# Patient Record
Sex: Male | Born: 1965 | Race: White | Marital: Single
Health system: Southern US, Community
[De-identification: ages and names within clinical notes are randomized; demographics above are authoritative.]

## PROBLEM LIST (undated history)

## (undated) DIAGNOSIS — F319 Bipolar disorder, unspecified: Secondary | ICD-10-CM

---

## 2020-09-18 ENCOUNTER — Ambulatory Visit: Payer: Medicare Other | Admitting: Podiatry

## 2020-09-22 ENCOUNTER — Ambulatory Visit: Payer: Medicare Other | Admitting: Podiatry

## 2020-09-24 ENCOUNTER — Ambulatory Visit: Payer: Medicare Other | Admitting: Podiatry

## 2020-12-29 ENCOUNTER — Other Ambulatory Visit: Payer: Self-pay

## 2020-12-29 ENCOUNTER — Ambulatory Visit (HOSPITAL_COMMUNITY)
Admission: EM | Admit: 2020-12-29 | Discharge: 2020-12-29 | Disposition: A | Discharge status: Other Acute Inpatient Hospital | Payer: Medicare Other | Attending: Psychiatry | Admitting: Psychiatry

## 2020-12-29 DIAGNOSIS — F312 Bipolar disorder, current episode manic severe with psychotic features: Secondary | ICD-10-CM | POA: Diagnosis not present

## 2020-12-29 DIAGNOSIS — Z20822 Contact with and (suspected) exposure to covid-19: Secondary | ICD-10-CM | POA: Diagnosis not present

## 2020-12-29 DIAGNOSIS — Z79899 Other long term (current) drug therapy: Secondary | ICD-10-CM | POA: Diagnosis not present

## 2020-12-29 LAB — TSH: TSH: 1.003 u[IU]/mL (ref 0.350–4.500)

## 2020-12-29 LAB — POCT URINE DRUG SCREEN - MANUAL ENTRY (I-SCREEN)
POC Amphetamine UR: NOT DETECTED
POC Buprenorphine (BUP): NOT DETECTED
POC Cocaine UR: NOT DETECTED
POC Marijuana UR: POSITIVE — AB
POC Methadone UR: NOT DETECTED
POC Methamphetamine UR: NOT DETECTED
POC Morphine: NOT DETECTED
POC Oxazepam (BZO): NOT DETECTED
POC Oxycodone UR: NOT DETECTED
POC Secobarbital (BAR): NOT DETECTED

## 2020-12-29 LAB — URINALYSIS, ROUTINE W REFLEX MICROSCOPIC
Bilirubin Urine: NEGATIVE
Glucose, UA: NEGATIVE mg/dL
Hgb urine dipstick: NEGATIVE
Ketones, ur: 5 mg/dL — AB
Leukocytes,Ua: NEGATIVE
Nitrite: NEGATIVE
Protein, ur: NEGATIVE mg/dL
Specific Gravity, Urine: 1.003 — ABNORMAL LOW (ref 1.005–1.030)
pH: 7 (ref 5.0–8.0)

## 2020-12-29 LAB — RESP PANEL BY RT-PCR (FLU A&B, COVID) ARPGX2
Influenza A by PCR: NEGATIVE
Influenza B by PCR: NEGATIVE
SARS Coronavirus 2 by RT PCR: NEGATIVE

## 2020-12-29 LAB — POC SARS CORONAVIRUS 2 AG -  ED: SARS Coronavirus 2 Ag: NEGATIVE

## 2020-12-29 LAB — POC SARS CORONAVIRUS 2 AG: SARSCOV2ONAVIRUS 2 AG: NEGATIVE

## 2020-12-29 MED ORDER — OLANZAPINE 5 MG PO TABS
ORAL_TABLET | ORAL | Status: AC
  Administered 2020-12-29: 5 mg via ORAL
  Filled 2020-12-29: qty 1

## 2020-12-29 MED ORDER — OLANZAPINE 5 MG PO TABS: 5.0000 mg | ORAL_TABLET | Freq: Every day | ORAL | Status: DC

## 2020-12-29 MED ORDER — OLANZAPINE 5 MG PO TABS
5.0000 mg | ORAL_TABLET | Freq: Every day | ORAL | Status: DC
  Administered 2020-12-29: 5 mg via ORAL

## 2020-12-29 NOTE — Progress Notes (Signed)
Patient meets inpatient criteria per Dr. Morrie Sheldon.  Per [facility] no [facility] beds available.  CSW to refer out.  Patient was referred to the following facilities:  Service Provider Address Phone Fax  [facility]  [address], [place] [address] [phone]  [facility]  [address] [place] [address] [phone]  [facility]  [address] [place] [address] [phone]  [facility]  [address] [place], [place] [address] [phone]  [facility]  [address] [place] [address] [phone]  [facility]  [address] [place] [address] [phone]  [facility]  [address] [place] [address] [phone]  [facility]  [address], [place] [address] [phone]  [facility]  [address], [place] [address] [phone]  [facility]  [address] [place], [place] [address] [phone]  [facility]  [address] [place] [address] [phone]     CSW will continue to monitor for disposition.  Penni Homans, MSW, LCSW 12/29/2020 1:29 PM

## 2020-12-29 NOTE — ED Notes (Signed)
Ambulated per self to retrieve belongings. No s/s pain or discomfort. No SI or HI. Escorted out back s[place] to safe transport. Transported to [facility]. Medically stable at time of transfer
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
## 2020-12-29 NOTE — Discharge Instructions (Addendum)
Please speak w/ SW about your eviction and possessions.

## 2020-12-29 NOTE — ED Notes (Signed)
Report called to [facility] charge nurse Misty Stanley, RN

## 2020-12-29 NOTE — ED Notes (Addendum)
Pt ambulated per self on unit. No s/s pain, discomfort, or acute distress noted. A&O x4. Denies SI, HI, AVH. Endorsing paranoia. Oriented to unit and staff. Will continue to monitor for safety

## 2020-12-29 NOTE — ED Notes (Signed)
Safe transportation called for transports to [facility]
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
## 2020-12-30 ENCOUNTER — Encounter (HOSPITAL_COMMUNITY): Payer: Self-pay | Admitting: Student in an Organized Health Care Education/Training Program

## 2020-12-30 ENCOUNTER — Emergency Department (HOSPITAL_COMMUNITY): Payer: Medicare Other

## 2020-12-30 ENCOUNTER — Encounter (HOSPITAL_COMMUNITY): Payer: Self-pay

## 2020-12-30 ENCOUNTER — Emergency Department (HOSPITAL_COMMUNITY)
Admission: EM | Admit: 2020-12-30 | Discharge: 2020-12-31 | Disposition: A | Discharge status: Home / Self Care | Payer: Medicare Other | Source: Home / Self Care | Attending: Emergency Medicine | Admitting: Emergency Medicine

## 2020-12-30 ENCOUNTER — Other Ambulatory Visit: Payer: Self-pay

## 2020-12-30 ENCOUNTER — Emergency Department (HOSPITAL_COMMUNITY)
Admission: EM | Admit: 2020-12-30 | Discharge: 2020-12-30 | Disposition: A | Discharge status: Home / Self Care | Payer: Medicare Other | Attending: Emergency Medicine | Admitting: Emergency Medicine

## 2020-12-30 DIAGNOSIS — R109 Unspecified abdominal pain: Secondary | ICD-10-CM | POA: Diagnosis not present

## 2020-12-30 DIAGNOSIS — R000 Tachycardia, unspecified: Secondary | ICD-10-CM | POA: Insufficient documentation

## 2020-12-30 DIAGNOSIS — R198 Other specified symptoms and signs involving the digestive system and abdomen: Secondary | ICD-10-CM | POA: Diagnosis not present

## 2020-12-30 DIAGNOSIS — F319 Bipolar disorder, unspecified: Secondary | ICD-10-CM | POA: Diagnosis not present

## 2020-12-30 DIAGNOSIS — R197 Diarrhea, unspecified: Secondary | ICD-10-CM | POA: Diagnosis present

## 2020-12-30 DIAGNOSIS — N179 Acute kidney failure, unspecified: Secondary | ICD-10-CM | POA: Diagnosis not present

## 2020-12-30 LAB — CBC WITH DIFFERENTIAL/PLATELET
Abs Immature Granulocytes: 0.08 10*3/uL — ABNORMAL HIGH (ref 0.00–0.07)
Basophils Absolute: 0 10*3/uL (ref 0.0–0.1)
Basophils Relative: 0 %
Eosinophils Absolute: 0 10*3/uL (ref 0.0–0.5)
Eosinophils Relative: 0 %
HCT: 43.5 % (ref 39.0–52.0)
Hemoglobin: 14.9 g/dL (ref 13.0–17.0)
Immature Granulocytes: 1 %
Lymphocytes Relative: 16 %
Lymphs Abs: 2.4 10*3/uL (ref 0.7–4.0)
MCH: 33.8 pg (ref 26.0–34.0)
MCHC: 34.3 g/dL (ref 30.0–36.0)
MCV: 98.6 fL (ref 80.0–100.0)
Monocytes Absolute: 1.6 10*3/uL — ABNORMAL HIGH (ref 0.1–1.0)
Monocytes Relative: 11 %
Neutro Abs: 10.8 10*3/uL — ABNORMAL HIGH (ref 1.7–7.7)
Neutrophils Relative %: 72 %
Platelets: 145 10*3/uL — ABNORMAL LOW (ref 150–400)
RBC: 4.41 MIL/uL (ref 4.22–5.81)
RDW: 13.7 % (ref 11.5–15.5)
WBC: 14.9 10*3/uL — ABNORMAL HIGH (ref 4.0–10.5)
nRBC: 0 % (ref 0.0–0.2)

## 2020-12-30 LAB — HEPATIC FUNCTION PANEL
ALT: 50 U/L — ABNORMAL HIGH (ref 0–44)
AST: 73 U/L — ABNORMAL HIGH (ref 15–41)
Albumin: 4.6 g/dL (ref 3.5–5.0)
Alkaline Phosphatase: 96 U/L (ref 38–126)
Bilirubin, Direct: 0.3 mg/dL — ABNORMAL HIGH (ref 0.0–0.2)
Indirect Bilirubin: 0.7 mg/dL (ref 0.3–0.9)
Total Bilirubin: 1 mg/dL (ref 0.3–1.2)
Total Protein: 7.8 g/dL (ref 6.5–8.1)

## 2020-12-30 LAB — BASIC METABOLIC PANEL
Anion gap: 14 (ref 5–15)
BUN: 30 mg/dL — ABNORMAL HIGH (ref 6–20)
CO2: 22 mmol/L (ref 22–32)
Calcium: 9.5 mg/dL (ref 8.9–10.3)
Chloride: 104 mmol/L (ref 98–111)
Creatinine, Ser: 1.52 mg/dL — ABNORMAL HIGH (ref 0.61–1.24)
GFR, Estimated: 54 mL/min — ABNORMAL LOW (ref >60)
Glucose, Bld: 91 mg/dL (ref 70–99)
Potassium: 3.8 mmol/L (ref 3.5–5.1)
Sodium: 140 mmol/L (ref 135–145)

## 2020-12-30 LAB — LIPASE, BLOOD: Lipase: 52 U/L — ABNORMAL HIGH (ref 11–51)

## 2020-12-30 MED ORDER — IOHEXOL 350 MG/ML SOLN
80.0000 mL | Freq: Once | INTRAVENOUS | Status: AC | PRN
  Administered 2020-12-30: 80 mL via INTRAVENOUS

## 2020-12-30 MED ORDER — SODIUM CHLORIDE 0.9 % IV BOLUS
1000.0000 mL | Freq: Once | INTRAVENOUS | Status: AC
  Administered 2020-12-30: 1000 mL via INTRAVENOUS

## 2020-12-30 NOTE — ED Notes (Signed)
Went in to discharge pt and he was not in his room. MD aware.

## 2020-12-30 NOTE — ED Provider Notes (Signed)
[facility]-EMERGENCY DEPT Provider Note   CSN: [id number] Arrival date & time: 12/30/20  1341     History Chief Complaint  Patient presents with   Diarrhea    Gregory Cross is a 55 y.o. male.  Patient is a 55 year old male senting for complaints of diarrhea.  Patient states "I have liquid leaking from my rectum" x 24 hours.  Denies abdominal pain, fever, chills, nausea, vomiting.  No sick contacts.  Denies recent antibiotic use or previous history of C. difficile.  Denies hx of UC or IBS.  The history is provided by the patient. No language interpreter was used.  Diarrhea Quality:  Watery Severity:  Severe Onset quality:  Sudden Duration:  24 hours Associated symptoms: no abdominal pain, no arthralgias, no chills, no fever and no vomiting       Past Medical History:  Diagnosis Date   Bipolar 1 disorder (HCC)     There are no problems to display for this patient.   No past surgical history on file.     No family history on file.  Social History   Tobacco Use   Smoking status: Unknown    Home Medications Prior to Admission medications   Medication Sig Start Date End Date Taking? Authorizing Provider  albuterol (VENTOLIN HFA) 108 (90 Base) MCG/ACT inhaler Inhale 2 puffs into the lungs every 4 (four) hours as needed for shortness of breath. 10/14/20  Yes [provider]  Cholecalciferol (VITAMIN D3) 50 MCG (2000 UT) capsule Take 2,000 Units by mouth daily.   Yes [provider]  diphenhydramine-acetaminophen (TYLENOL PM) 25-500 MG TABS tablet Take 2 tablets by mouth at bedtime as needed (For sleep or pain.).   Yes [provider]  losartan (COZAAR) 50 MG tablet Take 50 mg by mouth daily. 11/10/20  Yes [provider]  methocarbamol (ROBAXIN) 500 MG tablet Take 500 mg by mouth daily as needed for muscle spasms. 10/29/20  Yes [provider]  Misc Natural Products (ADV TURMERIC CURCUMIN COMPLEX PO) Take 1  tablet by mouth daily.   Yes [provider]  Multiple Vitamin (MULTIVITAMIN WITH MINERALS) TABS tablet Take 1 tablet by mouth daily.   Yes [provider]  POTASSIUM PO Take 1 tablet by mouth daily.   Yes [provider]    Allergies    Patient has no known allergies.  Review of Systems   Review of Systems  Constitutional:  Negative for chills and fever.  HENT:  Negative for ear pain and sore throat.   Eyes:  Negative for pain and visual disturbance.  Respiratory:  Negative for cough and shortness of breath.   Cardiovascular:  Negative for chest pain and palpitations.  Gastrointestinal:  Positive for diarrhea. Negative for abdominal pain and vomiting.  Genitourinary:  Negative for dysuria and hematuria.  Musculoskeletal:  Negative for arthralgias and back pain.  Skin:  Negative for color change and rash.  Neurological:  Negative for seizures and syncope.  All other systems reviewed and are negative.  Physical Exam Updated Vital Signs BP (!) 129/92   Pulse 88   Temp 98.2 F (36.8 C) (Oral)   Resp 16   Ht 5\' 11"  (1.803 m)   Wt 68 kg   SpO2 99%   BMI 20.92 kg/m   Physical Exam Vitals and nursing note reviewed.  Constitutional:      Appearance: He is well-developed.     Comments: Foul odor.  Patient's cloths saturated in feculent material.  HENT:     Head: Normocephalic and atraumatic.  Eyes:     Conjunctiva/sclera: Conjunctivae normal.  Cardiovascular:     Rate and Rhythm: Normal rate and regular rhythm.     Heart sounds: No murmur heard. Pulmonary:     Effort: Pulmonary effort is normal. No respiratory distress.     Breath sounds: Normal breath sounds.  Abdominal:     Palpations: Abdomen is soft.     Tenderness: There is no abdominal tenderness.  Musculoskeletal:     Cervical back: Neck supple.  Skin:    General: Skin is warm and dry.  Neurological:     Mental Status: He is alert.     GCS: GCS eye subscore is 4. GCS verbal subscore  is 5. GCS motor subscore is 6.    ED Results / Procedures / Treatments   Labs (all labs ordered are listed, but only abnormal results are displayed) Labs Reviewed  BASIC METABOLIC PANEL - Abnormal; Notable for the following components:      Result Value   BUN 30 (*)    Creatinine, Ser 1.52 (*)    GFR, Estimated 54 (*)    All other components within normal limits  CBC WITH DIFFERENTIAL/PLATELET - Abnormal; Notable for the following components:   WBC 14.9 (*)    Platelets 145 (*)    Neutro Abs 10.8 (*)    Monocytes Absolute 1.6 (*)    Abs Immature Granulocytes 0.08 (*)    All other components within normal limits  HEPATIC FUNCTION PANEL - Abnormal; Notable for the following components:   AST 73 (*)    ALT 50 (*)    Bilirubin, Direct 0.3 (*)    All other components within normal limits  LIPASE, BLOOD - Abnormal; Notable for the following components:   Lipase 52 (*)    All other components within normal limits  OVA + PARASITE EXAM    EKG None  Radiology CT ABDOMEN PELVIS W CONTRAST  Result Date: 12/30/2020 CLINICAL DATA:  Diarrhea EXAM: CT ABDOMEN AND PELVIS WITH CONTRAST TECHNIQUE: Multidetector CT imaging of the abdomen and pelvis was performed using the standard protocol following bolus administration of intravenous contrast. CONTRAST:  110mL OMNIPAQUE IOHEXOL 350 MG/ML SOLN COMPARISON:  None. FINDINGS: Lower chest: Lung bases are clear. No effusions. Heart is normal size. Hepatobiliary: Diffuse low-density throughout the liver compatible with fatty infiltration. No focal abnormality. Gallbladder unremarkable. Pancreas: No focal abnormality or ductal dilatation. Spleen: No focal abnormality.  Normal size. Adrenals/Urinary Tract: Scattered hypodensities in the kidneys, likely small cysts less than 1 cm. No hydronephrosis. Adrenal glands and urinary bladder unremarkable. Stomach/Bowel: Stomach, large and small bowel grossly unremarkable. Vascular/Lymphatic: No evidence of aneurysm or  adenopathy. Aortic atherosclerosis. Reproductive: Mildly prominent prostate with central calcifications. Other: No free fluid or free air. Musculoskeletal: No acute bony abnormality. IMPRESSION: Mild hepatic steatosis. No bowel abnormality seen. No acute findings. Aortic atherosclerosis. Electronically Signed   By: Charlett Nose M.D.   On: 12/30/2020 18:02    Procedures Procedures   Medications Ordered in ED Medications  sodium chloride 0.9 % bolus 1,000 mL (0 mLs Intravenous Stopped 12/30/20 1855)  iohexol (OMNIPAQUE) 350 MG/ML injection 80 mL (80 mLs Intravenous Contrast Given 12/30/20 1731)    ED Course  I have reviewed the triage vital signs and the nursing notes.  Pertinent labs & imaging results that were available during my care of the patient were reviewed by me and considered in my medical decision making (see chart  for details).    MDM Rules/Calculators/A&P                           55 year old male senting for complaints of diarrhea.  Patient states "I have liquid leaking from my rectum" x 24 hours.  Patient is alert and oriented x3, no acute distress, afebrile, stable vital signs.  Physical exam demonstrates no abdominal tenderness.  Foul odor present.  Patient's clothing saturated in feculent material. Patient remained stable on reevaluation.  -Stable electrolytes.  AKI present likely secondary to dehydration.  IV fluids given. -Ova, parasites, and C. difficile culture ordered, pending collection upon next bowel movement.   CT abdomen results back demonstrating no acute process.  I went to patient's room to discuss findings with him and patient unable to be found at this time.  Patient is no longer in his room, no belongings are gone.  Spoke with patient's nurse who states she has not seen him.  IV was not removed by nursing or medical personnel.  Security was notified.  Patient likely eloped from emergency department.   Final Clinical Impression(s) / ED Diagnoses Final  diagnoses:  Diarrhea, unspecified type  AKI (acute kidney injury) [facility])    Rx / DC Orders ED Discharge Orders     None        Franne Forts, DO 12/31/20 1410

## 2020-12-30 NOTE — ED Triage Notes (Signed)
Ems brings pt in for diarrhea. Pt states he has had clear liquid leaking from his rectum for 24 hours. Now he states his stool is more solid. Pt denies abdominal pain.

## 2020-12-30 NOTE — Discharge Instructions (Addendum)
Today you gave stool samples for testing for ova, parasite, and C. Difficile.  I will call you in the next 48 hours if results come back positive for any reason further recommendations.  In the meantime, stay rated by drinking 8 to 10 12 oz glasses of water a day.

## 2020-12-30 NOTE — ED Triage Notes (Signed)
Pt reports having diarrhea

## 2020-12-31 ENCOUNTER — Emergency Department (HOSPITAL_COMMUNITY)
Admission: EM | Admit: 2020-12-31 | Discharge: 2021-01-01 | Disposition: A | Discharge status: Home / Self Care | Payer: Medicare Other | Attending: Emergency Medicine | Admitting: Emergency Medicine

## 2020-12-31 ENCOUNTER — Encounter (HOSPITAL_COMMUNITY): Payer: Self-pay

## 2020-12-31 DIAGNOSIS — F319 Bipolar disorder, unspecified: Secondary | ICD-10-CM | POA: Insufficient documentation

## 2020-12-31 DIAGNOSIS — Z1330 Encounter for screening examination for mental health and behavioral disorders, unspecified: Secondary | ICD-10-CM | POA: Insufficient documentation

## 2020-12-31 DIAGNOSIS — Z20822 Contact with and (suspected) exposure to covid-19: Secondary | ICD-10-CM | POA: Insufficient documentation

## 2020-12-31 DIAGNOSIS — R197 Diarrhea, unspecified: Secondary | ICD-10-CM | POA: Diagnosis not present

## 2020-12-31 DIAGNOSIS — F1510 Other stimulant abuse, uncomplicated: Secondary | ICD-10-CM | POA: Diagnosis not present

## 2020-12-31 DIAGNOSIS — Z79899 Other long term (current) drug therapy: Secondary | ICD-10-CM | POA: Insufficient documentation

## 2020-12-31 HISTORY — DX: Bipolar disorder, unspecified: F31.9

## 2020-12-31 LAB — RAPID URINE DRUG SCREEN, HOSP PERFORMED
Amphetamines: NOT DETECTED
Barbiturates: NOT DETECTED
Benzodiazepines: NOT DETECTED
Cocaine: NOT DETECTED
Opiates: NOT DETECTED
Tetrahydrocannabinol: POSITIVE — AB

## 2020-12-31 LAB — COMPREHENSIVE METABOLIC PANEL
ALT: 48 U/L — ABNORMAL HIGH (ref 0–44)
AST: 73 U/L — ABNORMAL HIGH (ref 15–41)
Albumin: 4.4 g/dL (ref 3.5–5.0)
Alkaline Phosphatase: 86 U/L (ref 38–126)
Anion gap: 10 (ref 5–15)
BUN: 23 mg/dL — ABNORMAL HIGH (ref 6–20)
CO2: 26 mmol/L (ref 22–32)
Calcium: 9.3 mg/dL (ref 8.9–10.3)
Chloride: 103 mmol/L (ref 98–111)
Creatinine, Ser: 0.93 mg/dL (ref 0.61–1.24)
GFR, Estimated: >60 mL/min (ref >60)
Glucose, Bld: 96 mg/dL (ref 70–99)
Potassium: 3.6 mmol/L (ref 3.5–5.1)
Sodium: 139 mmol/L (ref 135–145)
Total Bilirubin: 1.5 mg/dL — ABNORMAL HIGH (ref 0.3–1.2)
Total Protein: 7.3 g/dL (ref 6.5–8.1)

## 2020-12-31 LAB — CBC WITH DIFFERENTIAL/PLATELET
Abs Immature Granulocytes: 0.03 10*3/uL (ref 0.00–0.07)
Basophils Absolute: 0 10*3/uL (ref 0.0–0.1)
Basophils Relative: 0 %
Eosinophils Absolute: 0 10*3/uL (ref 0.0–0.5)
Eosinophils Relative: 0 %
HCT: 39.6 % (ref 39.0–52.0)
Hemoglobin: 13.7 g/dL (ref 13.0–17.0)
Immature Granulocytes: 0 %
Lymphocytes Relative: 22 %
Lymphs Abs: 2 10*3/uL (ref 0.7–4.0)
MCH: 34.4 pg — ABNORMAL HIGH (ref 26.0–34.0)
MCHC: 34.6 g/dL (ref 30.0–36.0)
MCV: 99.5 fL (ref 80.0–100.0)
Monocytes Absolute: 1.1 10*3/uL — ABNORMAL HIGH (ref 0.1–1.0)
Monocytes Relative: 12 %
Neutro Abs: 6.1 10*3/uL (ref 1.7–7.7)
Neutrophils Relative %: 66 %
Platelets: 147 10*3/uL — ABNORMAL LOW (ref 150–400)
RBC: 3.98 MIL/uL — ABNORMAL LOW (ref 4.22–5.81)
RDW: 13.8 % (ref 11.5–15.5)
WBC: 9.3 10*3/uL (ref 4.0–10.5)
nRBC: 0 % (ref 0.0–0.2)

## 2020-12-31 LAB — BASIC METABOLIC PANEL
Anion gap: 8 (ref 5–15)
BUN: 28 mg/dL — ABNORMAL HIGH (ref 6–20)
CO2: 24 mmol/L (ref 22–32)
Calcium: 9.2 mg/dL (ref 8.9–10.3)
Chloride: 107 mmol/L (ref 98–111)
Creatinine, Ser: 1 mg/dL (ref 0.61–1.24)
GFR, Estimated: >60 mL/min (ref >60)
Glucose, Bld: 97 mg/dL (ref 70–99)
Potassium: 3.6 mmol/L (ref 3.5–5.1)
Sodium: 139 mmol/L (ref 135–145)

## 2020-12-31 LAB — RESP PANEL BY RT-PCR (FLU A&B, COVID) ARPGX2
Influenza A by PCR: NEGATIVE
Influenza B by PCR: NEGATIVE
SARS Coronavirus 2 by RT PCR: NEGATIVE

## 2020-12-31 LAB — ETHANOL: Alcohol, Ethyl (B): <10 mg/dL (ref <10)

## 2020-12-31 MED ORDER — ZIPRASIDONE MESYLATE 20 MG IM SOLR: 20.0000 mg | Freq: Once | INTRAMUSCULAR | Status: DC

## 2020-12-31 MED ORDER — LOPERAMIDE HCL 2 MG PO CAPS
4.0000 mg | ORAL_CAPSULE | Freq: Once | ORAL | Status: AC
  Administered 2020-12-31: 4 mg via ORAL
  Filled 2020-12-31: qty 2

## 2020-12-31 MED ORDER — LACTATED RINGERS IV BOLUS
1000.0000 mL | Freq: Once | INTRAVENOUS | Status: AC
  Administered 2020-12-31: 1000 mL via INTRAVENOUS

## 2020-12-31 NOTE — BH Assessment (Signed)
Requested Hailey, RN and Morrie Sheldon, RN to place TTS machine in patient's room.

## 2020-12-31 NOTE — ED Triage Notes (Signed)
Pt states "I want that damn book the police officer wrote about me yesterday and I want these people terrorizing me in parking lot this am since 4am to stop"

## 2020-12-31 NOTE — Discharge Instructions (Signed)
Take loperamide (imodium AD) as needed for diarrhea.

## 2020-12-31 NOTE — ED Provider Notes (Signed)
[facility]-EMERGENCY DEPT Provider Note   CSN: [id number] Arrival date & time: 12/30/20  2136     History Chief Complaint  Patient presents with   Diarrhea    Gregory Cross is a 55 y.o. male.  The history is provided by the patient.  Diarrhea He was in the emergency department earlier today because of diarrhea.  He describes of liquid dripping out his anus since this morning.  There has been some mild abdominal cramping but no fever or chills and no nausea or vomiting.  He had a CT of his abdomen and pelvis and states that he thought he was told that he should go when she returned from the CT scan, so he never received any prescriptions.  He returns to the emergency department because he has had recurrence of the diarrhea.   History reviewed. No pertinent past medical history.  There are no problems to display for this patient.   History reviewed. No pertinent surgical history.     History reviewed. No pertinent family history.     Home Medications Prior to Admission medications   Medication Sig Start Date End Date Taking? Authorizing Provider  albuterol (VENTOLIN HFA) 108 (90 Base) MCG/ACT inhaler Inhale 2 puffs into the lungs every 4 (four) hours as needed for shortness of breath. 10/14/20   [provider]  Cholecalciferol (VITAMIN D3) 50 MCG (2000 UT) capsule Take 2,000 Units by mouth daily.    [provider]  diphenhydramine-acetaminophen (TYLENOL PM) 25-500 MG TABS tablet Take 2 tablets by mouth at bedtime as needed (For sleep or pain.).    [provider]  losartan (COZAAR) 50 MG tablet Take 50 mg by mouth daily. 11/10/20   [provider]  methocarbamol (ROBAXIN) 500 MG tablet Take 500 mg by mouth daily as needed for muscle spasms. 10/29/20   [provider]  Misc Natural Products (ADV TURMERIC CURCUMIN COMPLEX PO) Take 1 tablet by mouth daily.    [provider]  Multiple Vitamin (MULTIVITAMIN  WITH MINERALS) TABS tablet Take 1 tablet by mouth daily.    [provider]  POTASSIUM PO Take 1 tablet by mouth daily.    [provider]    Allergies    Patient has no known allergies.  Review of Systems   Review of Systems  Gastrointestinal:  Positive for diarrhea.  All other systems reviewed and are negative.  Physical Exam Updated Vital Signs BP 98/79 (BP Location: Left Arm)   Pulse (!) 116   Temp 98.5 F (36.9 C) (Oral)   Resp 18   SpO2 97%   Physical Exam Vitals and nursing note reviewed.  55 year old male, resting comfortably and in no acute distress. Vital signs are significant for elevated heart rate. Oxygen saturation is 97%, which is normal. Head is normocephalic and atraumatic. PERRLA, EOMI. Oropharynx is clear. Neck is nontender and supple without adenopathy or JVD. Back is nontender and there is no CVA tenderness. Lungs are clear without rales, wheezes, or rhonchi. Chest is nontender. Heart has regular rate and rhythm without murmur. Abdomen is soft, flat, nontender without masses or hepatosplenomegaly and peristalsis is hypoactive. Extremities have no cyanosis or edema, full range of motion is present. Skin is warm and dry without rash. Neurologic: Mental status is normal, cranial nerves are intact, there are no motor or sensory deficits.  ED Results / Procedures / Treatments   Labs (all labs ordered are listed, but only abnormal results are displayed) Labs Reviewed  BASIC METABOLIC PANEL - Abnormal; Notable for the following components:      Result Value   BUN 28 (*)    All other components within normal limits  GASTROINTESTINAL PANEL BY PCR, STOOL (REPLACES STOOL CULTURE)  C DIFFICILE QUICK SCREEN W PCR REFLEX     Procedures Procedures   Medications Ordered in ED Medications  lactated ringers bolus 1,000 mL (has no administration in time range)  loperamide (IMODIUM) capsule 4 mg (has no administration in time range)    ED  Course  I have reviewed the triage vital signs and the nursing notes.  Pertinent labs & imaging results that were available during my care of the patient were reviewed by me and considered in my medical decision making (see chart for details).   MDM Rules/Calculators/A&P                         Diarrhea of uncertain cause.  Old records are reviewed confirming ED visit earlier today at which time the CT was read as showing mild hepatic steatosis with no bowel abnormality seen.  Labs showed an elevated creatinine of 1.52 with BUN of 30.  BUN to creatinine ratio of 20 suggestive of dehydration.  He also had minimal elevation of lipase, mild elevation of ALT and AST.  Exam is benign at this point.  He is tachycardic, will give additional IV fluids and recheck metabolic panel.  He has no prior labs in the system, so it is not known if his elevated creatinine is new or not.  Labs show improvement in creatinine back to baseline.  He has not had any diarrhea in the emergency department, therefore he is felt to be safe for discharge.  He is advised to use over-the-counter loperamide as needed for diarrhea.  Final Clinical Impression(s) / ED Diagnoses Final diagnoses:  Diarrhea, unspecified type    Rx / DC Orders ED Discharge Orders     None        Dione Booze, MD 12/31/20 0430

## 2020-12-31 NOTE — ED Provider Notes (Signed)
Emergency Medicine Provider Triage Evaluation Note  Gregory Cross , a 55 y.o. male  was evaluated in triage.  Pt complains of "those damn police officers wrote a whole book on me, I was thrown out here without a damn sandwich or glass of water, I have nowhere to go and people keep following me in the parking lot ".  Has not taken any consumed any alcohol, history of bipolar but does not take any outpatient meds.  Patient is difficult to understand, agitated and screaming.  Review of Systems  Positive: Anal leakage, agitation, delusions Negative: SI, HI  Physical Exam  BP 136/83   Pulse 97   Temp 98 F (36.7 C)   Resp 20   Ht 5\' 11"  (1.803 m)   Wt 68 kg   SpO2 97%   BMI 20.91 kg/m  Gen:   Awake, Agitated and screaming at no one in the room.   Resp:  Normal effort  MSK:   Moves extremities without difficulty  Other:    Medical Decision Making  Medically screening exam initiated at 12:09 PM.  Appropriate orders placed.  Gregory Cross was informed that the remainder of the evaluation will be completed by another provider, this initial triage assessment does not replace that evaluation, and the importance of remaining in the ED until their evaluation is complete.     Ike Bene, PA-C 12/31/20 1212    03/02/21, MD 12/31/20 [phone]

## 2020-12-31 NOTE — ED Provider Notes (Signed)
[facility]-EMERGENCY DEPT Provider Note   CSN: [id number] Arrival date & time: 12/31/20  1046     History Chief Complaint  Patient presents with   Psychiatric Evaluation    Brodee Mauritz is a 55 y.o. male.  Patient is a 55 year old male presenting to ED for psychiatric evaluation.  Please see triage note for further details. Pt was seen by myself yesterday for concerns of watery diarrhea.  At this time patient presented to emergency department saturated in feces.  Was given IV fluids, evaluated for electrolyte abnormalities, had a negative CT abdomen, and labs sent for ova/parasites/C. difficile.  Prior to discharge, patient was unable to be located in emergency department and had eloped. Patient was brought back to emergency department today for erratic behavior stating that people were attacking him in the parking lot.  Patient denies any drug or fall use.  Denies previous history of psychiatric illness.  Recommend psychiatric evaluation at this time.  The history is provided by the patient. No language interpreter was used.  Mental Health Problem Presenting symptoms: disorganized thought process   Presenting symptoms: no suicidal thoughts   Degree of incapacity (severity):  Moderate Onset quality:  Unable to specify Timing:  Unable to specify Associated symptoms: no abdominal pain and no chest pain       Past Medical History:  Diagnosis Date   Bipolar 1 disorder (HCC)     There are no problems to display for this patient.   History reviewed. No pertinent surgical history.     History reviewed. No pertinent family history.  Social History   Tobacco Use   Smoking status: Unknown    Home Medications Prior to Admission medications   Medication Sig Start Date End Date Taking? Authorizing Provider  albuterol (VENTOLIN HFA) 108 (90 Base) MCG/ACT inhaler Inhale 2 puffs into the lungs every 4 (four) hours as needed for shortness of breath. 10/14/20   Yes [provider]  Ascorbic Acid (VITAMIN C PO) Take 1 tablet by mouth daily.   Yes [provider]  Cholecalciferol (VITAMIN D3) 50 MCG (2000 UT) TABS Take 2,000 Units by mouth daily.   Yes [provider]  clonazePAM (KLONOPIN) 2 MG tablet Take 2 mg by mouth See admin instructions. Take 2 mg by mouth one to two times a day as needed for for anxiety, tardive dystonia, and/or tardive dyskinesia   Yes [provider]  diphenhydramine-acetaminophen (TYLENOL PM) 25-500 MG TABS tablet Take 1 tablet by mouth at bedtime as needed (for sleep).   Yes [provider]  losartan (COZAAR) 50 MG tablet Take 50 mg by mouth daily. 11/10/20  Yes [provider]  methocarbamol (ROBAXIN) 500 MG tablet Take 500 mg by mouth daily as needed for muscle spasms. 10/29/20  Yes [provider]  Misc Natural Products (ADV TURMERIC CURCUMIN COMPLEX PO) Take 500 mg by mouth daily.   Yes [provider]  multivitamin (ONE-A-DAY MEN'S) TABS tablet Take 1 tablet by mouth daily.   Yes [provider]  POTASSIUM PO Take 200 mg by mouth daily as needed (for cramps, tardive dystonia, and/or tardive dyskinesia).   Yes [provider]    Allergies    Patient has no known allergies.  Review of Systems   Review of Systems  Constitutional:  Negative for chills and fever.  HENT:  Negative for ear pain and sore throat.   Eyes:  Negative for pain and visual disturbance.  Respiratory:  Negative for cough and  shortness of breath.   Cardiovascular:  Negative for chest pain and palpitations.  Gastrointestinal:  Negative for abdominal pain and vomiting.  Genitourinary:  Negative for dysuria and hematuria.  Musculoskeletal:  Negative for arthralgias and back pain.  Skin:  Negative for color change and rash.  Neurological:  Negative for seizures and syncope.  Psychiatric/Behavioral:  Negative for sleep disturbance and suicidal ideas.   All other  systems reviewed and are negative.  Physical Exam Updated Vital Signs BP 123/78 (BP Location: Right Arm)   Pulse 72   Temp 98 F (36.7 C)   Resp 18   Ht 5\' 11"  (1.803 m)   Wt 68 kg   SpO2 96%   BMI 20.91 kg/m   Physical Exam Vitals and nursing note reviewed.  Constitutional:      Appearance: He is well-developed.  HENT:     Head: Normocephalic and atraumatic.  Eyes:     Conjunctiva/sclera: Conjunctivae normal.  Cardiovascular:     Rate and Rhythm: Normal rate and regular rhythm.     Heart sounds: No murmur heard. Pulmonary:     Effort: Pulmonary effort is normal. No respiratory distress.     Breath sounds: Normal breath sounds.  Abdominal:     Palpations: Abdomen is soft.     Tenderness: There is no abdominal tenderness.  Musculoskeletal:     Cervical back: Neck supple.  Skin:    General: Skin is warm and dry.  Neurological:     Mental Status: He is alert.  Psychiatric:        Attention and Perception: He does not perceive auditory or visual hallucinations.        Mood and Affect: Mood is elated. Affect is labile.        Speech: Speech is tangential.        Thought Content: Thought content does not include homicidal or suicidal ideation. Thought content does not include homicidal or suicidal plan.    ED Results / Procedures / Treatments   Labs (all labs ordered are listed, but only abnormal results are displayed) Labs Reviewed  COMPREHENSIVE METABOLIC PANEL - Abnormal; Notable for the following components:      Result Value   BUN 23 (*)    AST 73 (*)    ALT 48 (*)    Total Bilirubin 1.5 (*)    All other components within normal limits  RAPID URINE DRUG SCREEN, HOSP PERFORMED - Abnormal; Notable for the following components:   Tetrahydrocannabinol POSITIVE (*)    All other components within normal limits  CBC WITH DIFFERENTIAL/PLATELET - Abnormal; Notable for the following components:   RBC 3.98 (*)    MCH 34.4 (*)    Platelets 147 (*)    Monocytes  Absolute 1.1 (*)    All other components within normal limits  RESP PANEL BY RT-PCR (FLU A&B, COVID) ARPGX2  ETHANOL    EKG EKG Interpretation  Date/Time:  Wednesday December 31 2020 13:30:58 EDT Ventricular Rate:  79 PR Interval:  152 QRS Duration: 110 QT Interval:  394 QTC Calculation: 451 R Axis:   93 Text Interpretation: Normal sinus rhythm Rightward axis Borderline ECG No old tracing to compare Confirmed by [id number] (Dione Booze) on 01/01/2021 12:36:05 AM  Radiology No results found.  Procedures Procedures   Medications Ordered in ED Medications - No data to display   ED Course  I have reviewed the triage vital signs and the nursing notes.  Pertinent labs & imaging results that were available  during my care of the patient were reviewed by me and considered in my medical decision making (see chart for details).    MDM Rules/Calculators/A&P                         55 year old male presenting to ED for psychiatric evaluation.  See HPI for further details.  On exam patient is alert, oriented x3, with a labile affect and tangential speech.  Signed out to oncoming physician while awaiting TTS evaluation.  Final Clinical Impression(s) / ED Diagnoses Final diagnoses:  Encounter for screening examination for mental health and behavioral disorders    Rx / DC Orders ED Discharge Orders     None        Franne Forts, DO 01/01/21 2001

## 2020-12-31 NOTE — BH Assessment (Signed)
Requested Ladona Ridgel, RN to place the TTS machine in patient's room for initial TTS assessment.

## 2020-12-31 NOTE — ED Notes (Signed)
Dr. Glick at bedside.  

## 2020-12-31 NOTE — ED Notes (Signed)
Disposition: Per Dorena Bodo, RN, patient is psych cleared. He does not meet criteria for inpatient treatment. The [facility] provider recommends patient to follow up with his current therapist/psychiatrist @ [facility]. Also, referrals will be provided for Substance Abuse follow-up supports. I will note discharge information on patient's AVS. The provider also recommends a Social Work Consult to provide patient with psychosocial referrals. He is newlyhomeless and will need resources for shelters, food, etc.

## 2020-12-31 NOTE — BH Assessment (Addendum)
Disposition:   Per Dorena Bodo, RN, patient is psych cleared. He does not meet criteria for inpatient treatment. The [facility] provider recommends patient  to follow up with his current therapist/psychiatrist @ [facility]. Also, referrals will be provided for Substance Abuse follow-up supports. Clinician will note discharge information on patient's AVS.   The [facility] provider recommends a Social Work Consult to provide patient with psychosocial referrals. He is newly homeless and will need resources for shelters, food, etc.   Patient's nurse Ladona Ridgel, RN) and EDP Ernie Avena, MD).
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
## 2021-01-01 NOTE — ED Notes (Addendum)
Patient yelling at staff, this nurse came to patient asking what the problem was, patient voiced frustration that staff would not give them their personal phone chargers, that his phone was dead. States he wants to take a shower, eat, and leave the department. Patient provided with toiletries wash cloths and a towel to wash up in bathroom. Given sandwhich, soda and applesauce. And cellphone charging in lobby at charging station next to patient. PA Mia and MD Whickline aware of situation.

## 2021-07-22 DEATH — deceased

## 2023-01-30 IMAGING — CT CT ABD-PELV W/ CM
2 of 5 series · 16 of 46 positions shown, 18 images · IV contrast (omnipaque)
Comparison: None.

CLINICAL DATA: Diarrhea

EXAM:
CT ABDOMEN AND PELVIS WITH CONTRAST
TECHNIQUE: Multidetector CT imaging of the abdomen and pelvis was performed
using the standard protocol following bolus administration of
intravenous contrast.
CONTRAST:  80mL OMNIPAQUE IOHEXOL 350 MG/ML SOLN

[Series 2: axial st · axial · 0.68mm/px · z∈[-476,-121]mm · 13 of 83 slices shown, 15 images]
[im 6/83  soft-tissue]
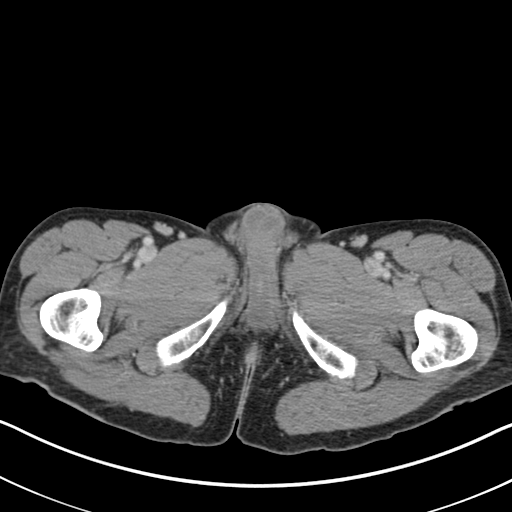
[im 6/83  bone]
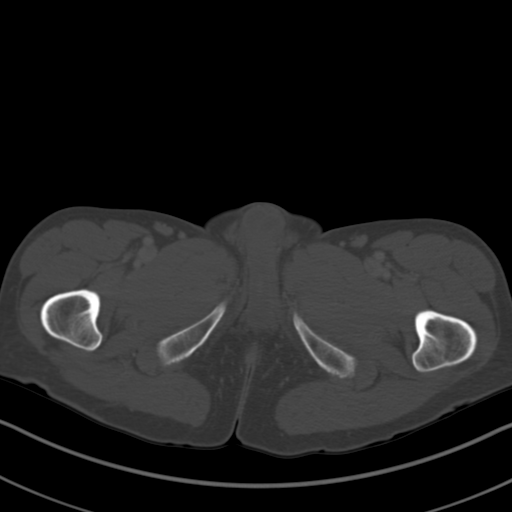
[im 12/83  soft-tissue]
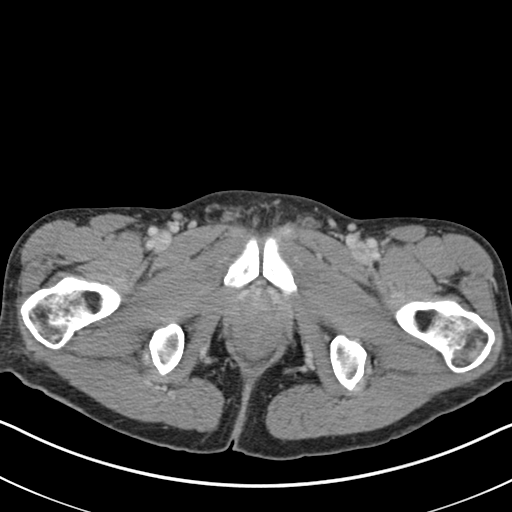
[im 18/83  soft-tissue]
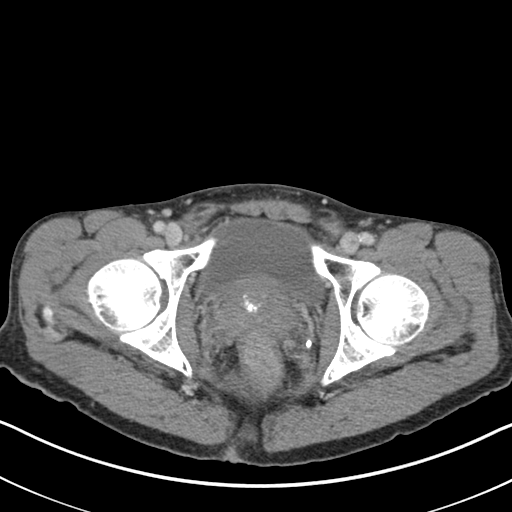
[im 24/83  soft-tissue]
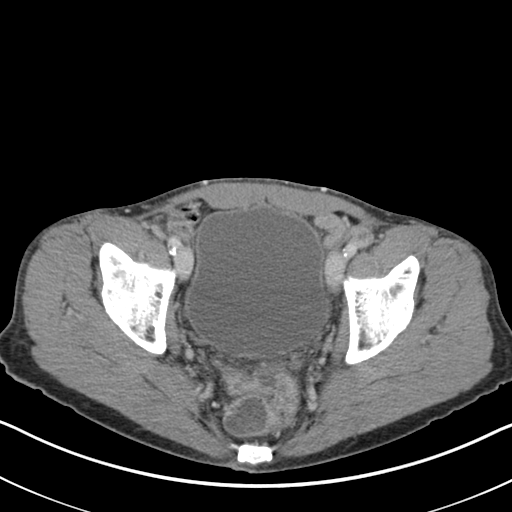
[im 30/83  soft-tissue]
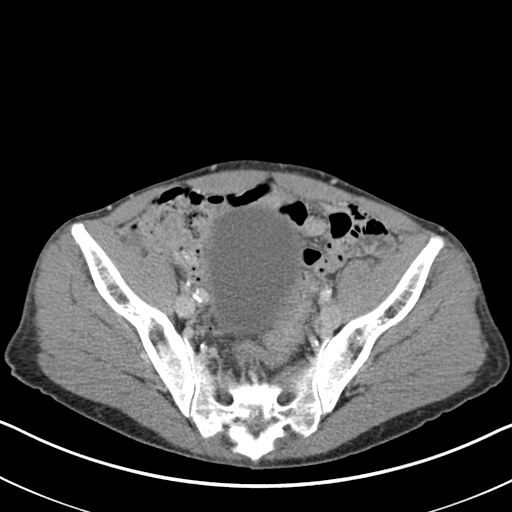
[im 36/83  soft-tissue]
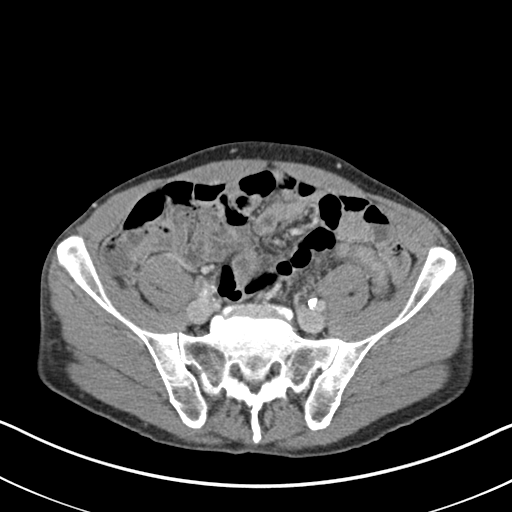
[im 42/83  soft-tissue]
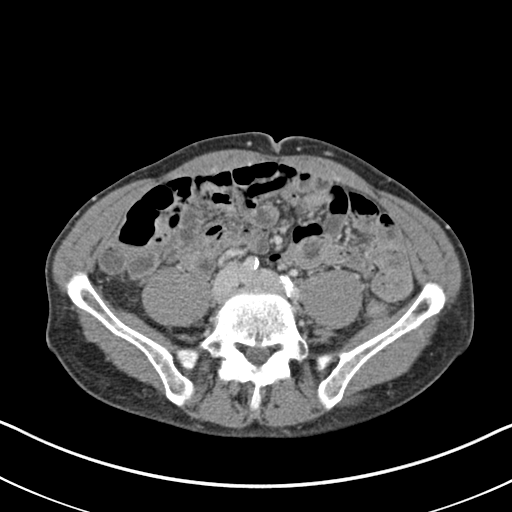
[im 47/83  soft-tissue]
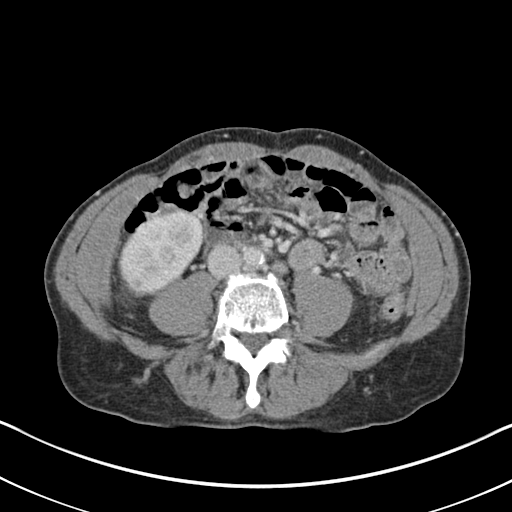
[im 53/83  soft-tissue]
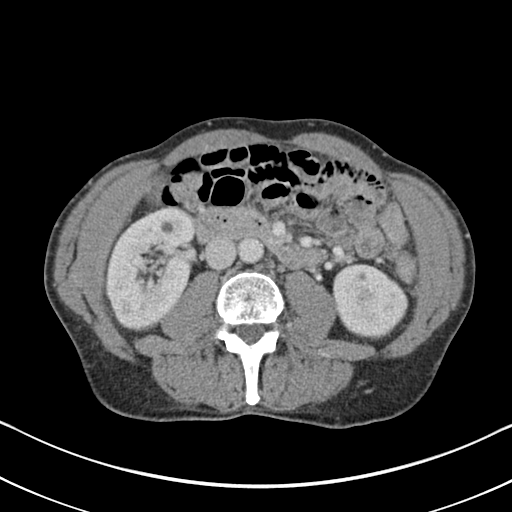
[im 53/83  bone]
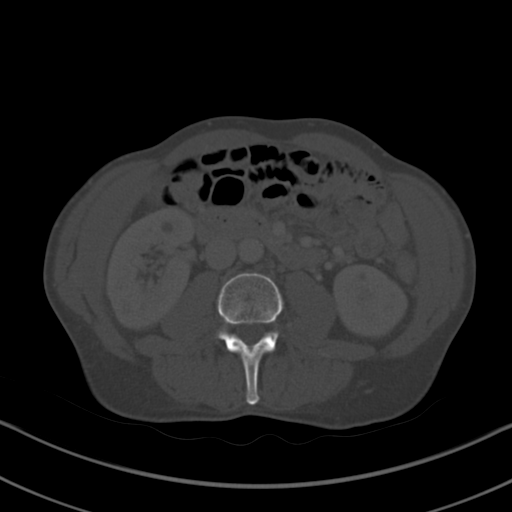
[im 59/83  soft-tissue]
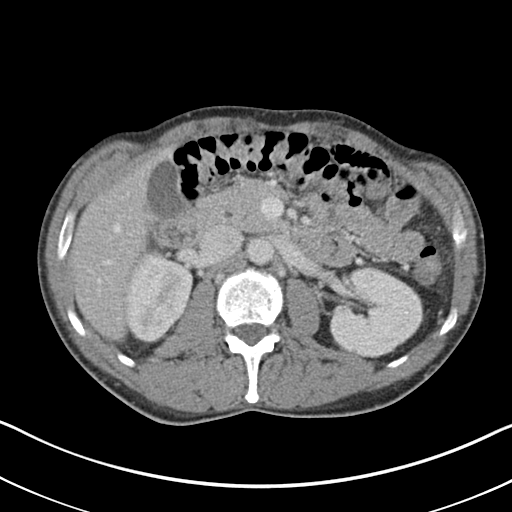
[im 65/83  soft-tissue]
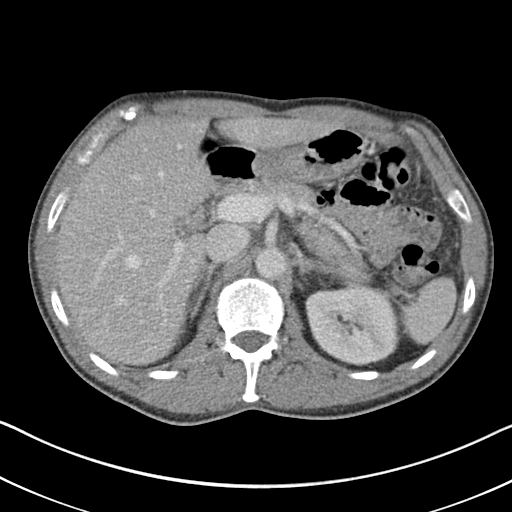
[im 71/83  soft-tissue]
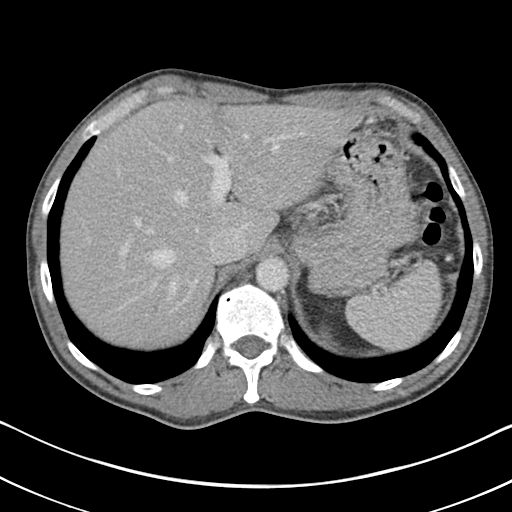
[im 77/83  soft-tissue]
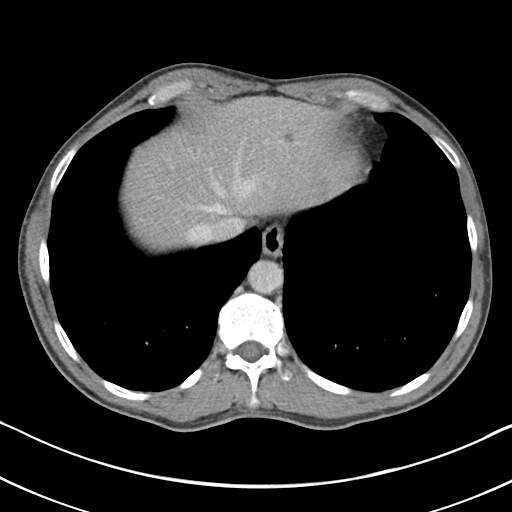

[Series 5: coronal st · coronal · 0.70mm/px · 3 of 143 slices shown]
[im 48/143  soft-tissue]
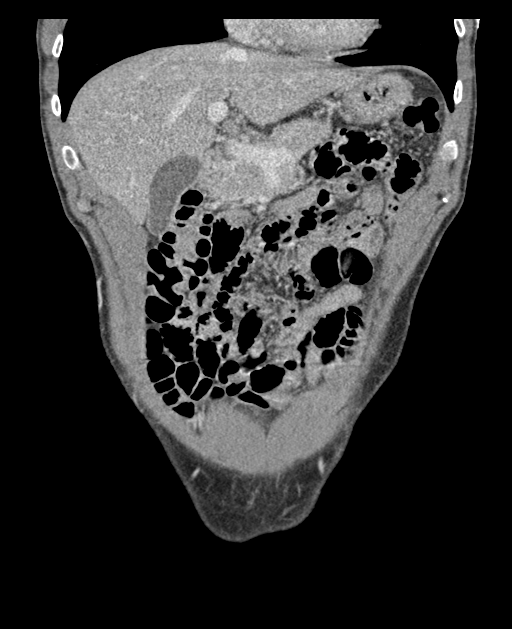
[im 64/143  soft-tissue]
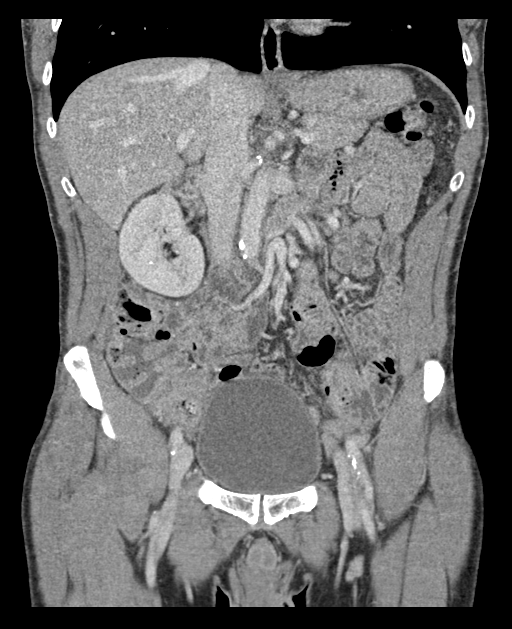
[im 79/143  soft-tissue]
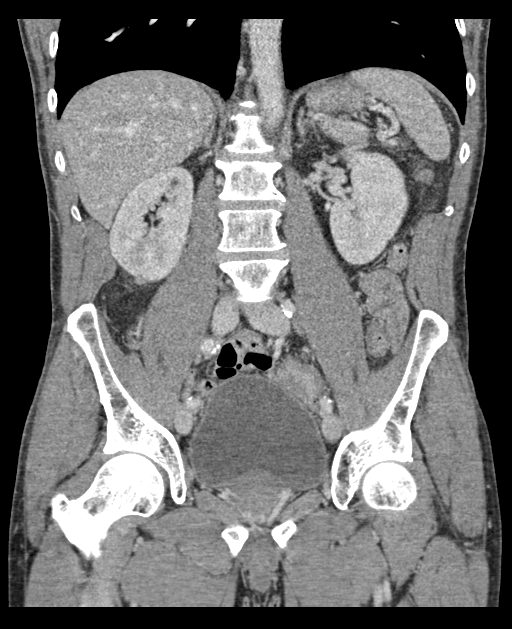

[16 of 46 positions shown; findings below may reference images not displayed]

FINDINGS: Lower chest: Lung bases are clear. No effusions. Heart is normal
size.

Hepatobiliary: Diffuse low-density throughout the liver compatible
with fatty infiltration. No focal abnormality. Gallbladder
unremarkable.

Pancreas: No focal abnormality or ductal dilatation.

Spleen: No focal abnormality.  Normal size.

Adrenals/Urinary Tract: Scattered hypodensities in the kidneys,
likely small cysts less than 1 cm. No hydronephrosis. Adrenal glands
and urinary bladder unremarkable.

Stomach/Bowel: Stomach, large and small bowel grossly unremarkable.

Vascular/Lymphatic: No evidence of aneurysm or adenopathy. Aortic
atherosclerosis.

Reproductive: Mildly prominent prostate with central calcifications.

Other: No free fluid or free air.

Musculoskeletal: No acute bony abnormality.
IMPRESSION: Mild hepatic steatosis.

No bowel abnormality seen.

No acute findings.

Aortic atherosclerosis.
